# Patient Record
Sex: Female | Born: 2000
Health system: Southern US, Community
[De-identification: ages and names within clinical notes are randomized; demographics above are authoritative.]

## PROBLEM LIST (undated history)

## (undated) DIAGNOSIS — E119 Type 2 diabetes mellitus without complications: Secondary | ICD-10-CM

## (undated) DIAGNOSIS — K3184 Gastroparesis: Secondary | ICD-10-CM

## (undated) HISTORY — PX: ADENOIDECTOMY: SUR15

## (undated) HISTORY — PX: GALLBLADDER SURGERY: SHX652

## (undated) HISTORY — PX: TONSILLECTOMY: SUR1361

## (undated) HISTORY — PX: TYMPANOSTOMY TUBE PLACEMENT: SHX32

---
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
## 2022-01-30 ENCOUNTER — Ambulatory Visit (INDEPENDENT_AMBULATORY_CARE_PROVIDER_SITE_OTHER): Payer: Medicaid Other | Admitting: Pediatric Endocrinology

## 2022-03-06 ENCOUNTER — Ambulatory Visit (INDEPENDENT_AMBULATORY_CARE_PROVIDER_SITE_OTHER): Payer: Medicaid Other | Admitting: Pediatric Endocrinology

## 2022-04-23 ENCOUNTER — Ambulatory Visit (INDEPENDENT_AMBULATORY_CARE_PROVIDER_SITE_OTHER): Payer: Medicaid Other | Admitting: Pediatric Endocrinology

## 2022-07-20 ENCOUNTER — Telehealth (INDEPENDENT_AMBULATORY_CARE_PROVIDER_SITE_OTHER): Payer: Self-pay | Admitting: Pediatric Endocrinology

## 2022-07-20 DIAGNOSIS — E1165 Type 2 diabetes mellitus with hyperglycemia: Secondary | ICD-10-CM

## 2022-07-20 NOTE — Telephone Encounter (Signed)
  Name of who is calling:[name]   Caller's Relationship to Patient:mother   Best contact [phone]   Provider they see:Dr.Badik   Reason for call:mom called to see if a referral could be sent to adult Endo? I did let he know that she may need to be seen by Dr. Baldo Ash first if they are able to send it. Mom asked for a call back if possible.      PRESCRIPTION REFILL ONLY  Name of prescription:  Pharmacy:

## 2022-07-24 ENCOUNTER — Other Ambulatory Visit (INDEPENDENT_AMBULATORY_CARE_PROVIDER_SITE_OTHER): Payer: Self-pay | Admitting: Pediatric Endocrinology

## 2022-07-24 DIAGNOSIS — E1165 Type 2 diabetes mellitus with hyperglycemia: Secondary | ICD-10-CM

## 2022-08-02 NOTE — Telephone Encounter (Signed)
Called and spoke to pts mom(DPR on file), just let her know I needed Dr Baldo Ash to reenter the referral and I will send it to [facility]. She stated understanding and I gave her their phone number to contact them in a few days.

## 2022-08-02 NOTE — Addendum Note (Signed)
Addended by: Ludwig Lean on: 08/02/2022 05:14 PM   Modules accepted: Orders

## 2022-08-02 NOTE — Telephone Encounter (Signed)
  Name of who is calling:[name]   Caller's Relationship to Patient:mother   Best contact number:[phone]  Provider they see:Dr.Badik   Reason for call:mom called and asked if the referral could be sent to [facility] on [address] In [place], [place] mom thought someone was going to call back so they could discuss where the referral should go. Mom asked for a call back.      PRESCRIPTION REFILL ONLY  Name of prescription:  Pharmacy:

## 2022-08-03 NOTE — Telephone Encounter (Signed)
Referral has been faxed to preferred location.

## 2022-12-03 ENCOUNTER — Emergency Department (HOSPITAL_BASED_OUTPATIENT_CLINIC_OR_DEPARTMENT_OTHER)
Admission: EM | Admit: 2022-12-03 | Discharge: 2022-12-03 | Disposition: A | Discharge status: Home / Self Care | Payer: Medicaid Other | Attending: Emergency Medicine | Admitting: Emergency Medicine

## 2022-12-03 ENCOUNTER — Other Ambulatory Visit: Payer: Self-pay

## 2022-12-03 ENCOUNTER — Emergency Department (HOSPITAL_BASED_OUTPATIENT_CLINIC_OR_DEPARTMENT_OTHER): Payer: Medicaid Other

## 2022-12-03 ENCOUNTER — Encounter (HOSPITAL_BASED_OUTPATIENT_CLINIC_OR_DEPARTMENT_OTHER): Payer: Self-pay | Admitting: Emergency Medicine

## 2022-12-03 DIAGNOSIS — E119 Type 2 diabetes mellitus without complications: Secondary | ICD-10-CM | POA: Diagnosis not present

## 2022-12-03 DIAGNOSIS — Z794 Long term (current) use of insulin: Secondary | ICD-10-CM | POA: Diagnosis not present

## 2022-12-03 DIAGNOSIS — Z7984 Long term (current) use of oral hypoglycemic drugs: Secondary | ICD-10-CM | POA: Insufficient documentation

## 2022-12-03 DIAGNOSIS — Z79899 Other long term (current) drug therapy: Secondary | ICD-10-CM | POA: Insufficient documentation

## 2022-12-03 DIAGNOSIS — M25561 Pain in right knee: Secondary | ICD-10-CM | POA: Diagnosis present

## 2022-12-03 MED ORDER — CELECOXIB 200 MG PO CAPS: 200.0000 mg | ORAL_CAPSULE | Freq: Two times a day (BID) | ORAL | 0 refills | Status: AC | PRN

## 2022-12-03 NOTE — ED Triage Notes (Signed)
Patient presents to ED via POV from home. Here with right knee pain that began last night. Denies injury or trauma. Ambulatory. Reports edema.

## 2022-12-03 NOTE — ED Provider Notes (Signed)
[place] [facility] Provider Note   CSN: [id number] Arrival date & time: 12/03/22  1214     History  Chief Complaint  Patient presents with   Knee Pain    Brandi Bowers is a 22 y.o. female.  Pt is a 22 yo female with pmhx significant for DM, DUB, obesity and gastroparesis.  Pt said she developed some right knee pain last night.  No trauma.  She has never had knee problems in the past.         Home Medications Prior to Admission medications   Medication Sig Start Date End Date Taking? Authorizing Provider  celecoxib (CELEBREX) 200 MG capsule Take 1 capsule (200 mg total) by mouth 2 (two) times daily as needed. 12/03/22  Yes Jacalyn Lefevre, MD  fluticasone (FLONASE) 50 MCG/ACT nasal spray PLACE 1 SPRAY INTO BOTH NOSTRILS DAILY. USE UNDER DEXCOM ADHESIVE. Patient not taking: Reported on 03/01/2021 11/14/20   [name], MD  albuterol (PROVENTIL HFA) 108 (90 Base) MCG/ACT inhaler Inhale 2 puffs into the lungs every 4 (four) hours as needed for wheezing or shortness of breath. Patient not taking: No sig reported 06/17/20 06/17/21  Kalman Jewels, MD  cephALEXin (KEFLEX) 500 MG capsule Take 1 capsule (500 mg total) by mouth 4 (four) times daily. 12/15/21   Rolan Bucco, MD  cetirizine (ZYRTEC) 10 MG tablet Take 10 mg by mouth daily as needed for allergies. Patient not taking: Reported on 10/30/2021    [provider]  Continuous Blood Gluc Sensor (DEXCOM G6 SENSOR) MISC 1 each by Does not apply route as directed. 1 sensor every 10 days 10/30/21   [name], MD  Continuous Blood Gluc Transmit (DEXCOM G6 TRANSMITTER) MISC 1 EACH BY DOES NOT APPLY ROUTE EVERY 3 (THREE) MONTHS. Patient not taking: Reported on 10/30/2021 09/11/21   [name], MD  ferrous sulfate 325 (65 FE) MG EC tablet Take 325 mg by mouth daily with breakfast.  08/25/15   [provider]  FLUoxetine (PROZAC) 40 MG capsule TAKE 1 CAPSULE BY MOUTH EVERY DAY  12/27/21   [name], MD  Glucagon (BAQSIMI TWO PACK) 3 MG/DOSE POWD Place 1 spray into the nose as directed. Patient not taking: Reported on 06/17/2020 06/03/20   [name], MD  glucose blood (ACCU-CHEK GUIDE) test strip NEEDS TO CHECK GLUCOSE 6X DAILY 09/05/20   [name], MD  insulin degludec (TRESIBA FLEXTOUCH) 200 UNIT/ML FlexTouch Pen Inject 100 Units into the skin daily. 10/30/21   [name], MD  insulin lispro (HUMALOG KWIKPEN) 200 UNIT/ML KwikPen Inject 50 Units into the skin in the morning, at noon, and at bedtime. 10/30/21   [name], MD  Insulin Pen Needle (BD PEN NEEDLE NANO U/F) 32G X 4 MM MISC INJECT INSULIN VIA INSULIN PEN 6 X DAILY 03/01/21   [name], MD  JUNEL FE 1/20 1-20 MG-MCG tablet TAKE 1 TABLET BY MOUTH EVERY DAY 09/11/21   [name], MD  lisinopril (ZESTRIL) 20 MG tablet TAKE 1 TABLET BY MOUTH EVERY DAY 12/27/21   [name], MD  metFORMIN (GLUCOPHAGE-XR) 750 MG 24 hr tablet TAKE 2 TABLETS (1,500 MG TOTAL) BY MOUTH EVERY DAY WITH BREAKFAST 09/11/21   [name], MD  [name] 3-0.02 MG tablet Take 1 tablet by mouth daily. 10/13/21   [provider]  NOVOLOG FLEXPEN 100 UNIT/ML FlexPen INJECT UP TO 50 UNITS DAILY AS DIRECTED BY MD 05/29/21   [name], MD  omeprazole (PRILOSEC) 20 MG capsule Take  1 capsule (20 mg total) by mouth 2 (two) times daily before a meal. Patient not taking: Reported on 03/01/2021 02/29/20   Ashok Pall, MD  Ostomy Supplies (SKIN TAC ADHESIVE BARRIER WIPE) MISC For use with dexcom 08/01/21   [name], MD  polyethylene glycol powder (GLYCOLAX/MIRALAX) 17 GM/SCOOP powder Give 1 to 2 caps daily as needed for constipation Patient not taking: Reported on 09/05/2020 06/03/20   [name], MD  tirzepatide [facility]) 5 MG/0.5ML Pen Inject 5 mg into the skin once a week. 08/01/21   [name], MD  Vitamin D, Cholecalciferol, 50 MCG (2000 UT) CAPS Take 1 capsule by mouth daily. 05/08/19   Gretchen Short, NP      Allergies    Doxycycline    Review of Systems   Review of Systems  Musculoskeletal:        R knee pain  All other systems reviewed and are negative.   Physical Exam Updated Vital Signs BP (!) 131/98   Pulse 88   Temp 97.8 F (36.6 C) (Oral)   Resp 18   Ht 5\' 4"  (1.626 m)   Wt 122 kg   LMP 11/20/2022   SpO2 99%   BMI 46.17 kg/m  Physical Exam Vitals and nursing note reviewed.  Constitutional:      Appearance: Normal appearance. She is obese.  HENT:     Head: Normocephalic and atraumatic.     Right Ear: External ear normal.     Left Ear: External ear normal.     Nose: Nose normal.     Mouth/Throat:     Mouth: Mucous membranes are moist.     Pharynx: Oropharynx is clear.  Eyes:     Extraocular Movements: Extraocular movements intact.     Conjunctiva/sclera: Conjunctivae normal.     Pupils: Pupils are equal, round, and reactive to light.  Cardiovascular:     Rate and Rhythm: Normal rate and regular rhythm.     Pulses: Normal pulses.     Heart sounds: Normal heart sounds.  Pulmonary:     Effort: Pulmonary effort is normal.     Breath sounds: Normal breath sounds.  Abdominal:     General: Abdomen is flat. Bowel sounds are normal.     Palpations: Abdomen is soft.  Musculoskeletal:     Cervical back: Normal range of motion and neck supple.       Legs:     Comments: Mild swelling to knee  Skin:    General: Skin is warm.     Capillary Refill: Capillary refill takes less than 2 seconds.  Neurological:     General: No focal deficit present.     Mental Status: She is alert and oriented to person, place, and time.  Psychiatric:        Mood and Affect: Mood normal.        Behavior: Behavior normal.     ED Results / Procedures / Treatments   Labs (all labs ordered are listed, but only abnormal results are displayed) Labs Reviewed - No data to display  EKG None  Radiology DG Knee Complete 4 Views Right  Result Date: 12/03/2022 CLINICAL  DATA:  Pain EXAM: RIGHT KNEE - COMPLETE 4 VIEW COMPARISON:  None Available. FINDINGS: No evidence of fracture, dislocation, or joint effusion. No evidence of arthropathy or other focal bone abnormality. Soft tissues are unremarkable. IMPRESSION: No acute osseous abnormality Electronically Signed   By: Karen Kays M.D.   On: 12/03/2022 12:46  Procedures Procedures    Medications Ordered in ED Medications - No data to display  ED Course/ Medical Decision Making/ A&P                             Medical Decision Making Amount and/or Complexity of Data Reviewed Radiology: ordered.  Risk Prescription drug management.   This patient presents to the ED for concern of knee pain, this involves an extensive number of treatment options, and is a complaint that carries with it a high risk of complications and morbidity.  The differential diagnosis includes fx, sprain   Co morbidities that complicate the patient evaluation  DM, DUB, obesity    Additional history obtained:  Additional history obtained from epic chart review  Imaging Studies ordered:  I ordered imaging studies including r knee  I independently visualized and interpreted imaging which showed   IMPRESSION:  No acute osseous abnormality   I agree with the radiologist interpretation   Cardiac Monitoring:  The patient was maintained on a cardiac monitor.  I personally viewed and interpreted the cardiac monitored which showed an underlying rhythm of: nsr   Medicines ordered and prescription drug management:   I have reviewed the patients home medicines and have made adjustments as needed   Test Considered:  xr   Problem List / ED Course:  R knee pain:  xray neg. Pt is stable for d/c.  She is given a knee brace, knee exercises, celebrex, and is told to f/u with ortho.  Return if worse.   Reevaluation:  After the interventions noted above, I reevaluated the patient and found that they have  :improved   Social Determinants of Health:  Lives at home   Dispostion:  After consideration of the diagnostic results and the patients response to treatment, I feel that the patent would benefit from discharge with outpatient f/u.          Final Clinical Impression(s) / ED Diagnoses Final diagnoses:  Acute pain of right knee    Rx / DC Orders ED Discharge Orders          Ordered    celecoxib (CELEBREX) 200 MG capsule  2 times daily PRN        12/03/22 1308              Jacalyn Lefevre, MD 12/03/22 1310

## 2023-01-04 ENCOUNTER — Encounter (INDEPENDENT_AMBULATORY_CARE_PROVIDER_SITE_OTHER): Payer: Self-pay

## 2023-01-17 ENCOUNTER — Encounter (INDEPENDENT_AMBULATORY_CARE_PROVIDER_SITE_OTHER): Payer: Self-pay

## 2023-09-16 ENCOUNTER — Emergency Department (HOSPITAL_BASED_OUTPATIENT_CLINIC_OR_DEPARTMENT_OTHER)
Admission: EM | Admit: 2023-09-16 | Discharge: 2023-09-16 | Disposition: A | Discharge status: Home / Self Care | Attending: Emergency Medicine | Admitting: Emergency Medicine

## 2023-09-16 ENCOUNTER — Other Ambulatory Visit: Payer: Self-pay

## 2023-09-16 ENCOUNTER — Encounter (HOSPITAL_BASED_OUTPATIENT_CLINIC_OR_DEPARTMENT_OTHER): Payer: Self-pay

## 2023-09-16 DIAGNOSIS — Z794 Long term (current) use of insulin: Secondary | ICD-10-CM | POA: Diagnosis not present

## 2023-09-16 DIAGNOSIS — K047 Periapical abscess without sinus: Secondary | ICD-10-CM | POA: Insufficient documentation

## 2023-09-16 DIAGNOSIS — K0889 Other specified disorders of teeth and supporting structures: Secondary | ICD-10-CM | POA: Diagnosis present

## 2023-09-16 MED ORDER — KETOROLAC TROMETHAMINE 15 MG/ML IJ SOLN
15.0000 mg | Freq: Once | INTRAMUSCULAR | Status: AC
  Administered 2023-09-16: 15 mg via INTRAMUSCULAR
  Filled 2023-09-16: qty 1

## 2023-09-16 MED ORDER — CLINDAMYCIN HCL 150 MG PO CAPS
450.0000 mg | ORAL_CAPSULE | Freq: Three times a day (TID) | ORAL | 0 refills | Status: AC
Start: 2023-09-16 — End: 2023-09-23

## 2023-09-16 NOTE — ED Triage Notes (Signed)
 Pt states that she thinks she may have a dental abscess on the right side of her mouth. States that she started having some pain and swelling yesterday.

## 2023-09-16 NOTE — Discharge Instructions (Addendum)
 Take the antibiotics as prescribed.  Please call your dentist today to try and set up an appointment.  I have given you a list of dentists in case you do not have 1.  Take 4 over the counter ibuprofen tablets 3 times a day or 2 over-the-counter naproxen tablets twice a day for pain. Also take tylenol 1000mg (2 extra strength) four times a day.

## 2023-09-16 NOTE — ED Notes (Signed)
 Pt alert and oriented X 4 at the time of discharge. RR even and unlabored. No acute distress noted. Pt verbalized understanding of discharge instructions as discussed. Pt ambulatory to lobby at time of discharge.

## 2023-09-16 NOTE — ED Provider Notes (Signed)
 [place] [facility] Provider Note   CSN: [id number] Arrival date & time: 09/16/23  1127     History  Chief Complaint  Patient presents with   Dental Pain    Brandi Bowers is a 23 y.o. female.  23 yo F with a chief complaint of right lower dental pain.  This has been going on for a couple days.  Started with some pain in her mouth and then she noticed that it started swelling last night.  Feels like it still getting more swollen.  She denies injury to the area.  Denies fevers or chills.   Dental Pain      Home Medications Prior to Admission medications   Medication Sig Start Date End Date Taking? Authorizing Provider  clindamycin (CLEOCIN) 150 MG capsule Take 3 capsules (450 mg total) by mouth 3 (three) times daily for 7 days. 09/16/23 09/23/23 Yes Hayven Croy, DO  fluticasone (FLONASE) 50 MCG/ACT nasal spray PLACE 1 SPRAY INTO BOTH NOSTRILS DAILY. USE UNDER DEXCOM ADHESIVE. Patient not taking: Reported on 03/01/2021 11/14/20   [name], MD  albuterol (PROVENTIL HFA) 108 (90 Base) MCG/ACT inhaler Inhale 2 puffs into the lungs every 4 (four) hours as needed for wheezing or shortness of breath. Patient not taking: No sig reported 06/17/20 06/17/21  Kalman Jewels, MD  celecoxib (CELEBREX) 200 MG capsule Take 1 capsule (200 mg total) by mouth 2 (two) times daily as needed. 12/03/22   Jacalyn Lefevre, MD  cephALEXin (KEFLEX) 500 MG capsule Take 1 capsule (500 mg total) by mouth 4 (four) times daily. 12/15/21   Rolan Bucco, MD  cetirizine (ZYRTEC) 10 MG tablet Take 10 mg by mouth daily as needed for allergies. Patient not taking: Reported on 10/30/2021    [provider]  Continuous Blood Gluc Sensor (DEXCOM G6 SENSOR) MISC 1 each by Does not apply route as directed. 1 sensor every 10 days 10/30/21   [name], MD  Continuous Blood Gluc Transmit (DEXCOM G6 TRANSMITTER) MISC 1 EACH BY DOES NOT APPLY ROUTE EVERY 3 (THREE)  MONTHS. Patient not taking: Reported on 10/30/2021 09/11/21   [name], MD  ferrous sulfate 325 (65 FE) MG EC tablet Take 325 mg by mouth daily with breakfast.  08/25/15   [provider]  FLUoxetine (PROZAC) 40 MG capsule TAKE 1 CAPSULE BY MOUTH EVERY DAY 12/27/21   [name], MD  Glucagon (BAQSIMI TWO PACK) 3 MG/DOSE POWD Place 1 spray into the nose as directed. Patient not taking: Reported on 06/17/2020 06/03/20   [name], MD  glucose blood (ACCU-CHEK GUIDE) test strip NEEDS TO CHECK GLUCOSE 6X DAILY 09/05/20   [name], MD  insulin degludec (TRESIBA FLEXTOUCH) 200 UNIT/ML FlexTouch Pen Inject 100 Units into the skin daily. 10/30/21   [name], MD  insulin lispro (HUMALOG KWIKPEN) 200 UNIT/ML KwikPen Inject 50 Units into the skin in the morning, at noon, and at bedtime. 10/30/21   [name], MD  Insulin Pen Needle (BD PEN NEEDLE NANO U/F) 32G X 4 MM MISC INJECT INSULIN VIA INSULIN PEN 6 X DAILY 03/01/21   [name], MD  JUNEL FE 1/20 1-20 MG-MCG tablet TAKE 1 TABLET BY MOUTH EVERY DAY 09/11/21   [name], MD  lisinopril (ZESTRIL) 20 MG tablet TAKE 1 TABLET BY MOUTH EVERY DAY 12/27/21   [name], MD  metFORMIN (GLUCOPHAGE-XR) 750 MG 24 hr tablet TAKE 2 TABLETS (1,500 MG TOTAL) BY MOUTH EVERY DAY WITH BREAKFAST 09/11/21  [name], MD  [name] 3-0.02 MG tablet Take 1 tablet by mouth daily. 10/13/21   [provider]  NOVOLOG FLEXPEN 100 UNIT/ML FlexPen INJECT UP TO 50 UNITS DAILY AS DIRECTED BY MD 05/29/21   [name], MD  omeprazole (PRILOSEC) 20 MG capsule Take 1 capsule (20 mg total) by mouth 2 (two) times daily before a meal. Patient not taking: Reported on 03/01/2021 02/29/20   Ashok Pall, MD  Ostomy Supplies (SKIN TAC ADHESIVE BARRIER WIPE) MISC For use with dexcom 08/01/21   [name], MD  polyethylene glycol powder (GLYCOLAX/MIRALAX) 17 GM/SCOOP powder Give 1 to 2 caps daily as needed for constipation Patient  not taking: Reported on 09/05/2020 06/03/20   [name], MD  tirzepatide [facility]) 5 MG/0.5ML Pen Inject 5 mg into the skin once a week. 08/01/21   [name], MD  Vitamin D, Cholecalciferol, 50 MCG (2000 UT) CAPS Take 1 capsule by mouth daily. 05/08/19   Gretchen Short, NP      Allergies    Doxycycline    Review of Systems   Review of Systems  Physical Exam Updated Vital Signs BP (!) 162/91 (BP Location: Left Arm)   Pulse 76   Temp 98.1 F (36.7 C) (Oral)   Resp 15   Ht 5\' 4"  (1.626 m)   Wt 122.5 kg   LMP 08/18/2023 (Approximate)   SpO2 98%   BMI 46.35 kg/m  Physical Exam Vitals and nursing note reviewed.  Constitutional:      General: She is not in acute distress.    Appearance: She is well-developed. She is not diaphoretic.  HENT:     Head: Normocephalic and atraumatic.     Mouth/Throat:     Comments: Relatively good dentition diffusely.  The patient does have some plaque along the gumline especially on the right lower aspect.  Her right lower first molar does have some fullness about the root.  She does have some edema throughout the jaw.  No sublingual swelling tolerating secretions without difficulty. Eyes:     Pupils: Pupils are equal, round, and reactive to light.  Cardiovascular:     Rate and Rhythm: Normal rate and regular rhythm.     Heart sounds: No murmur heard.    No friction rub. No gallop.  Pulmonary:     Effort: Pulmonary effort is normal.     Breath sounds: No wheezing or rales.  Abdominal:     General: There is no distension.     Palpations: Abdomen is soft.     Tenderness: There is no abdominal tenderness.  Musculoskeletal:        General: No tenderness.     Cervical back: Normal range of motion and neck supple.  Skin:    General: Skin is warm and dry.  Neurological:     Mental Status: She is alert and oriented to person, place, and time.  Psychiatric:        Behavior: Behavior normal.     ED Results / Procedures / Treatments    Labs (all labs ordered are listed, but only abnormal results are displayed) Labs Reviewed - No data to display  EKG None  Radiology No results found.  Procedures Procedures    Medications Ordered in ED Medications  ketorolac (TORADOL) 15 MG/ML injection 15 mg (has no administration in time range)    ED Course/ Medical Decision Making/ A&P  Medical Decision Making Risk Prescription drug management.   23 yo F with a chief complaint of right lower dental pain.  Going on for couple days.  Clinically the patient could have an abscess at the base of her right first molar.  I did offer dental block and I&D which she is currently declining.  Will start on oral antibiotics.  Dentistry follow-up.  11:51 AM:  I have discussed the diagnosis/risks/treatment options with the patient.  Evaluation and diagnostic testing in the emergency department does not suggest an emergent condition requiring admission or immediate intervention beyond what has been performed at this time.  They will follow up with Dentistry. We also discussed returning to the ED immediately if new or worsening sx occur. We discussed the sx which are most concerning (e.g., sudden worsening pain, fever, inability to tolerate by mouth) that necessitate immediate return. Medications administered to the patient during their visit and any new prescriptions provided to the patient are listed below.  Medications given during this visit Medications  ketorolac (TORADOL) 15 MG/ML injection 15 mg (has no administration in time range)     The patient appears reasonably screen and/or stabilized for discharge and I doubt any other medical condition or other [facility] requiring further screening, evaluation, or treatment in the ED at this time prior to discharge.          Final Clinical Impression(s) / ED Diagnoses Final diagnoses:  Dental infection    Rx / DC Orders ED Discharge Orders           Ordered    clindamycin (CLEOCIN) 150 MG capsule  3 times daily        09/16/23 1149              Melene Plan, DO 09/16/23 1152

## 2023-10-08 ENCOUNTER — Encounter (INDEPENDENT_AMBULATORY_CARE_PROVIDER_SITE_OTHER): Payer: Self-pay

## 2023-10-21 ENCOUNTER — Encounter (INDEPENDENT_AMBULATORY_CARE_PROVIDER_SITE_OTHER): Payer: Self-pay

## 2024-07-22 ENCOUNTER — Encounter (HOSPITAL_BASED_OUTPATIENT_CLINIC_OR_DEPARTMENT_OTHER): Payer: Self-pay | Admitting: Emergency Medicine

## 2024-07-22 ENCOUNTER — Emergency Department (HOSPITAL_BASED_OUTPATIENT_CLINIC_OR_DEPARTMENT_OTHER)
Admission: EM | Admit: 2024-07-22 | Discharge: 2024-07-23 | Disposition: A | Discharge status: Home / Self Care | Attending: Emergency Medicine | Admitting: Emergency Medicine

## 2024-07-22 ENCOUNTER — Emergency Department (HOSPITAL_BASED_OUTPATIENT_CLINIC_OR_DEPARTMENT_OTHER)

## 2024-07-22 ENCOUNTER — Other Ambulatory Visit: Payer: Self-pay

## 2024-07-22 DIAGNOSIS — Z794 Long term (current) use of insulin: Secondary | ICD-10-CM | POA: Insufficient documentation

## 2024-07-22 DIAGNOSIS — M25571 Pain in right ankle and joints of right foot: Secondary | ICD-10-CM | POA: Diagnosis present

## 2024-07-22 MED ORDER — NAPROXEN 500 MG PO TABS: 500.0000 mg | ORAL_TABLET | Freq: Two times a day (BID) | ORAL | 0 refills | Status: AC

## 2024-07-22 MED ORDER — HYDROCODONE-ACETAMINOPHEN 5-325 MG PO TABS: 1.0000 | ORAL_TABLET | Freq: Four times a day (QID) | ORAL | 0 refills | Status: AC | PRN

## 2024-07-22 NOTE — ED Provider Notes (Signed)
 " [place] [facility] Provider Note   CSN: [id number] Arrival date & time: 07/22/24  2024     Patient presents with: Ankle Pain   Brandi Bowers is a 24 y.o. female.   Patient is a 24 year old female presenting with complaints of right ankle pain.  This began yesterday evening in the absence of any specific injury or trauma.  She does report being at work and on her feet for prolonged period of time.  She describes some swelling to her ankle and the top of her foot.  Pain is worse with ambulation.  No alleviating factors.       Prior to Admission medications  Medication Sig Start Date End Date Taking? Authorizing Provider  fluticasone  (FLONASE ) 50 MCG/ACT nasal spray PLACE 1 SPRAY INTO BOTH NOSTRILS DAILY. USE UNDER DEXCOM ADHESIVE. Patient not taking: Reported on 03/01/2021 11/14/20   Dorrene Nest, MD  albuterol  (PROVENTIL  HFA) 108 [phone] Base) MCG/ACT inhaler Inhale 2 puffs into the lungs every 4 (four) hours as needed for wheezing or shortness of breath. Patient not taking: No sig reported 06/17/20 06/17/21  Jonah Fallow, MD  celecoxib  (CELEBREX ) 200 MG capsule Take 1 capsule (200 mg total) by mouth 2 (two) times daily as needed. 12/03/22   Dean Clarity, MD  cephALEXin  (KEFLEX ) 500 MG capsule Take 1 capsule (500 mg total) by mouth 4 (four) times daily. 12/15/21   Lenor Hollering, MD  cetirizine (ZYRTEC) 10 MG tablet Take 10 mg by mouth daily as needed for allergies. Patient not taking: Reported on 10/30/2021    [provider]  Continuous Blood Gluc Sensor (DEXCOM G6 SENSOR) MISC 1 each by Does not apply route as directed. 1 sensor every 10 days 10/30/21   Dorrene Nest, MD  Continuous Blood Gluc Transmit (DEXCOM G6 TRANSMITTER) MISC 1 EACH BY DOES NOT APPLY ROUTE EVERY 3 (THREE) MONTHS. Patient not taking: Reported on 10/30/2021 09/11/21   Dorrene Nest, MD  ferrous sulfate 325 (65 FE) MG EC tablet Take 325 mg by mouth daily with  breakfast.  08/25/15   [provider]  FLUoxetine  (PROZAC ) 40 MG capsule TAKE 1 CAPSULE BY MOUTH EVERY DAY 12/27/21   Dorrene Nest, MD  Glucagon  (BAQSIMI  TWO PACK) 3 MG/DOSE POWD Place 1 spray into the nose as directed. Patient not taking: Reported on 06/17/2020 06/03/20   Dorrene Nest, MD  glucose blood (ACCU-CHEK GUIDE) test strip NEEDS TO CHECK GLUCOSE 6X DAILY 09/05/20   Dorrene Nest, MD  insulin  degludec (TRESIBA  FLEXTOUCH) 200 UNIT/ML FlexTouch Pen Inject 100 Units into the skin daily. 10/30/21   Dorrene Nest, MD  insulin  lispro (HUMALOG  KWIKPEN) 200 UNIT/ML KwikPen Inject 50 Units into the skin in the morning, at noon, and at bedtime. 10/30/21   Dorrene Nest, MD  Insulin  Pen Needle (BD PEN NEEDLE NANO U/F) 32G X 4 MM MISC INJECT INSULIN  VIA INSULIN  PEN 6 X DAILY 03/01/21   Dorrene Nest, MD  JUNEL FE 1/20 1-20 MG-MCG tablet TAKE 1 TABLET BY MOUTH EVERY DAY 09/11/21   Dorrene Nest, MD  lisinopril  (ZESTRIL ) 20 MG tablet TAKE 1 TABLET BY MOUTH EVERY DAY 12/27/21   Dorrene Nest, MD  metFORMIN  (GLUCOPHAGE -XR) 750 MG 24 hr tablet TAKE 2 TABLETS (1,500 MG TOTAL) BY MOUTH EVERY DAY WITH BREAKFAST 09/11/21   Dorrene Nest, MD  [name] 3-0.02 MG tablet Take 1 tablet by mouth daily. 10/13/21   [provider]  NOVOLOG  FLEXPEN 100 UNIT/ML FlexPen INJECT UP TO 50 UNITS DAILY AS  DIRECTED BY MD 05/29/21   Dorrene Nest, MD  omeprazole  (PRILOSEC) 20 MG capsule Take 1 capsule (20 mg total) by mouth 2 (two) times daily before a meal. Patient not taking: Reported on 03/01/2021 02/29/20   Wendolyn Birmingham, MD  Ostomy Supplies (SKIN TAC ADHESIVE BARRIER WIPE) MISC For use with dexcom 08/01/21   Dorrene Nest, MD  polyethylene glycol powder (GLYCOLAX /MIRALAX ) 17 GM/SCOOP powder Give 1 to 2 caps daily as needed for constipation Patient not taking: Reported on 09/05/2020 06/03/20   Dorrene Nest, MD  tirzepatide  (MOUNJARO ) 5 MG/0.5ML Pen Inject 5 mg into the skin once a week. 08/01/21    Dorrene Nest, MD  Vitamin D , Cholecalciferol, 50 MCG (2000 UT) CAPS Take 1 capsule by mouth daily. 05/08/19   Verdon Darnel, NP    Allergies: Doxycycline    Review of Systems  All other systems reviewed and are negative.   Updated Vital Signs BP (!) 156/99 (BP Location: Left Arm)   Pulse 95   Temp 98.2 F (36.8 C)   Resp 18   Ht 5' 4 (1.626 m)   LMP 06/17/2024 (Approximate)   SpO2 98%   BMI 46.35 kg/m   Physical Exam Vitals and nursing note reviewed.  HENT:     Head: Normocephalic.  Pulmonary:     Effort: Pulmonary effort is normal.  Musculoskeletal:     Comments: There is tenderness to palpation of the dorsum of the foot.  She has pain with range of motion of the ankle.  There is no warmth, redness, or palpable effusion.  Skin:    General: Skin is warm and dry.  Neurological:     Mental Status: She is alert and oriented to person, place, and time.     (all labs ordered are listed, but only abnormal results are displayed) Labs Reviewed - No data to display  EKG: None  Radiology: DG Ankle Complete Right Result Date: 07/22/2024 EXAM: 3 OR MORE VIEW(S) XRAY OF THE ANKLE 07/22/2024 09:17:00 PM CLINICAL HISTORY: swelling COMPARISON: None available. FINDINGS: BONES AND JOINTS: No acute fracture. No malalignment. SOFT TISSUES: Mild soft tissue swelling more prominent medially. IMPRESSION: 1. No fracture or dislocation. Electronically signed by: Pinkie Pebbles MD 07/22/2024 09:26 PM EST RP Workstation: [id number]     Procedures   Medications Ordered in the ED - No data to display                                  Medical Decision Making Amount and/or Complexity of Data Reviewed Radiology: ordered.   Patient presenting with right ankle pain in the absence of any specific injury or trauma and negative x-rays.  Cause of her discomfort is unclear, but suspect possibly overuse from standing for an extended period of time.  Her presentation is inconsistent with  DVT.  She has no calf swelling or tenderness.  She also was preeclamptic the end of October and delivered her baby November 1.  Her blood pressure is somewhat elevated, but I do not feel this is related to preeclampsia as it is only involving her right ankle.  She is also nearly 3 months out from childbirth.  Patient will be given crutches and an Ace bandage.  She will be discharged with NSAIDs and tramadol.  To follow-up as needed.     Final diagnoses:  None    ED Discharge Orders     None  Geroldine Berg, MD 07/22/24 2337  "

## 2024-07-22 NOTE — ED Notes (Signed)
 Pt. Reports she is postpartum. Was admitted due to Pre-eclampsia at the end of october and ended up having to be induced beginning of November. EDP made aware and will communicate it with on coming EDP. Pt. Noticed ankle swelling last night  denies known injury or other symptoms. Pt. BP elevated at triage.

## 2024-07-22 NOTE — Discharge Instructions (Signed)
 Begin taking naproxen  as prescribed.  Begin taking hydrocodone  as prescribed as needed for pain not relieved with naproxen .  Wear Ace bandage for comfort and support.  Weightbearing as tolerated.  Follow-up with primary doctor if not improving in the next week.

## 2024-07-22 NOTE — ED Triage Notes (Signed)
 Pt c/o R foot and ankle pain and swelling since last night. Denies known injury.
